# Patient Record
Sex: Male | Born: 2005 | Race: Black or African American | Hispanic: No | Marital: Single | Smoking: Never smoker
Health system: Southern US, Community
[De-identification: ages and names within clinical notes are randomized; demographics above are authoritative.]

---
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
## 2023-08-20 ENCOUNTER — Other Ambulatory Visit: Payer: Self-pay

## 2023-08-20 ENCOUNTER — Emergency Department (HOSPITAL_BASED_OUTPATIENT_CLINIC_OR_DEPARTMENT_OTHER)

## 2023-08-20 ENCOUNTER — Emergency Department (HOSPITAL_BASED_OUTPATIENT_CLINIC_OR_DEPARTMENT_OTHER)
Admission: EM | Admit: 2023-08-20 | Discharge: 2023-08-20 | Disposition: A | Discharge status: Home / Self Care | Attending: Emergency Medicine | Admitting: Emergency Medicine

## 2023-08-20 ENCOUNTER — Encounter (HOSPITAL_BASED_OUTPATIENT_CLINIC_OR_DEPARTMENT_OTHER): Payer: Self-pay

## 2023-08-20 DIAGNOSIS — S99921A Unspecified injury of right foot, initial encounter: Secondary | ICD-10-CM | POA: Diagnosis present

## 2023-08-20 DIAGNOSIS — Y9367 Activity, basketball: Secondary | ICD-10-CM | POA: Insufficient documentation

## 2023-08-20 DIAGNOSIS — X509XXA Other and unspecified overexertion or strenuous movements or postures, initial encounter: Secondary | ICD-10-CM | POA: Diagnosis not present

## 2023-08-20 DIAGNOSIS — S93601A Unspecified sprain of right foot, initial encounter: Secondary | ICD-10-CM | POA: Insufficient documentation

## 2023-08-20 NOTE — ED Notes (Signed)
 Ambulatory to room without difficulty/assistance

## 2023-08-20 NOTE — ED Provider Notes (Signed)
  EMERGENCY DEPARTMENT AT [facility] Provider Note   CSN: [id number] Arrival date & time: 08/20/23  4098     History  Chief Complaint  Patient presents with   Foot Pain    Albert Kelly is a 18 y.o. male, who presents to the ED secondary to right foot pain, has been going on for the past week.  He states about a week ago, he is playing basketball with some friends, and jumped, and then landed on his friend steel toe boot, and hurt the right foot, on the side.  He states for the past week or so it has been very tender, and sharp, especially when he plants his foot down, but he has been able to ambulate.  Denies any ankle pain, or difficulty with range of motion, just states is painful when he flaps his foot down.  Notes that it went away, but yesterday he played basketball, and it flared up again.  Denies any bruising, or wounds to the area.  Has not had any surgery on his foot.  Home Medications Prior to Admission medications   Not on File      Allergies    Patient has no known allergies.    Review of Systems   Review of Systems  Musculoskeletal:  Positive for arthralgias.  Skin:  Negative for wound.    Physical Exam Updated Vital Signs BP (!) 130/40   Pulse 60   Temp 98.5 F (36.9 C) (Oral)   Resp 16   Ht 5\' 7"  (1.702 m)   SpO2 100%  Physical Exam Vitals and nursing note reviewed.  Constitutional:      General: He is not in acute distress.    Appearance: He is well-developed.  HENT:     Head: Normocephalic and atraumatic.  Eyes:     General:        Right eye: No discharge.        Left eye: No discharge.     Conjunctiva/sclera: Conjunctivae normal.  Pulmonary:     Effort: No respiratory distress.  Musculoskeletal:     Comments: Right foot: TTP of base and midshaft of 5th metatarsal. No edema noted. Able to bear weight. Able to plantar flex and dorsiflex ankle. Inversion/eversion intact. Negative Thompson test. No midfoot tenderness to  palpation. Capillary refill <2sec. Dorsalis pedis pulse present. No foot drop noted. Sensation intact. Warm to touch.    Neurological:     Mental Status: He is alert.     Comments: Clear speech.   Psychiatric:        Behavior: Behavior normal.        Thought Content: Thought content normal.     ED Results / Procedures / Treatments   Labs (all labs ordered are listed, but only abnormal results are displayed) Labs Reviewed - No data to display  EKG None  Radiology DG Foot Complete Right Result Date: 08/20/2023 CLINICAL DATA:  Fifth metatarsal pain after a basketball injury 1 week ago. EXAM: RIGHT FOOT COMPLETE - 3+ VIEW COMPARISON:  None Available. FINDINGS: No acute osseous or joint abnormality. IMPRESSION: Negative. Electronically Signed   By: Leanna Battles M.D.   On: 08/20/2023 10:32    Procedures Procedures    Medications Ordered in ED Medications - No data to display  ED Course/ Medical Decision Making/ A&P  Medical Decision Making Patient is a 18 year old male, here for right lateral foot pain, this been going on for the last week.  Improved, but yesterday got aggravated after playing basketball.  He has tenderness to palpation of the fifth metatarsal at the base, and midshaft.  He states he slept his foot down, last week, onto his friend steel toe boot, it hurt for a few days, and then resolved.  He states the pain is pretty mild right now, but is sharp, when he plants his foot down.  Has not had any bruising or swelling.  We will obtain an x-ray, it has been 7 days, so this is reassuring evaluate for fracture.  Especially given the pain resolved, and then reoccurred.  Offered Tylenol and ibuprofen which the patient declined  Amount and/or Complexity of Data Reviewed Radiology: ordered.    Details: X-rays unremarkable Discussion of management or test interpretation with external provider(s): Patient is able to ambulate, and doing well on  exam, only hurts, on more severe exertion, such as playing sports.  He is overall well-appearing, he has no evidence of any kind of fracture on x-ray.  It has been 7 days, thus no fracture on x-ray reassuring.  I encouraged him to follow-up with a primary care doctor, return if symptoms are worsening.  Given that it has been over a week, it resolved, then came back, and suspicious more likely secondary to a strain, or contusion.  I encouraged him to follow-up with primary care doctor, however if symptoms recur, for possible CT of the foot.  At this time I do not think is necessary and he was placed in a postop shoe    Final Clinical Impression(s) / ED Diagnoses Final diagnoses:  Sprain of right foot, initial encounter    Rx / DC Orders ED Discharge Orders     None         Gusta Marksberry, [place], PA 08/20/23 1051    [place], DO 08/20/23 1054

## 2023-08-20 NOTE — ED Triage Notes (Signed)
 States last week was playing basketball and jumped and landed on his friends steel toe shoes. Had pain after, which had improved. Played basketball yesterday and woke up with pain today. Pain to lateral right foot.

## 2023-08-20 NOTE — Discharge Instructions (Addendum)
 You have pain, in the side of your foot, this may be from a foot sprain, or contusion.  Please ice, elevate, and rest the area.  Use these flat toe shoe, as needed, for relief.  If you have worsening pain, please return to the ER follow-up with your primary care doctor.

## 2024-03-17 ENCOUNTER — Other Ambulatory Visit: Payer: Self-pay

## 2024-03-17 ENCOUNTER — Encounter (HOSPITAL_BASED_OUTPATIENT_CLINIC_OR_DEPARTMENT_OTHER): Payer: Self-pay

## 2024-03-17 ENCOUNTER — Emergency Department (HOSPITAL_BASED_OUTPATIENT_CLINIC_OR_DEPARTMENT_OTHER)
Admission: EM | Admit: 2024-03-17 | Discharge: 2024-03-17 | Disposition: A | Discharge status: Home / Self Care | Attending: Emergency Medicine | Admitting: Emergency Medicine

## 2024-03-17 DIAGNOSIS — M546 Pain in thoracic spine: Secondary | ICD-10-CM | POA: Diagnosis present

## 2024-03-17 DIAGNOSIS — Y9367 Activity, basketball: Secondary | ICD-10-CM | POA: Insufficient documentation

## 2024-03-17 DIAGNOSIS — S29019A Strain of muscle and tendon of unspecified wall of thorax, initial encounter: Secondary | ICD-10-CM

## 2024-03-17 DIAGNOSIS — W19XXXA Unspecified fall, initial encounter: Secondary | ICD-10-CM | POA: Insufficient documentation

## 2024-03-17 DIAGNOSIS — S29012A Strain of muscle and tendon of back wall of thorax, initial encounter: Secondary | ICD-10-CM | POA: Insufficient documentation

## 2024-03-17 MED ORDER — METHOCARBAMOL 500 MG PO TABS: 500.0000 mg | ORAL_TABLET | Freq: Three times a day (TID) | ORAL | 0 refills | Status: AC | PRN

## 2024-03-17 MED ORDER — DEXAMETHASONE SOD PHOSPHATE PF 10 MG/ML IJ SOLN
10.0000 mg | Freq: Once | INTRAMUSCULAR | Status: AC
  Administered 2024-03-17: 10 mg via INTRAMUSCULAR

## 2024-03-17 MED ORDER — KETOROLAC TROMETHAMINE 30 MG/ML IJ SOLN
30.0000 mg | Freq: Once | INTRAMUSCULAR | Status: AC
  Administered 2024-03-17: 30 mg via INTRAMUSCULAR
  Filled 2024-03-17: qty 1

## 2024-03-17 MED ORDER — IBUPROFEN 800 MG PO TABS: 800.0000 mg | ORAL_TABLET | Freq: Three times a day (TID) | ORAL | 0 refills | Status: AC | PRN

## 2024-03-17 MED ORDER — DICLOFENAC SODIUM 1 % EX GEL: 2.0000 g | Freq: Four times a day (QID) | CUTANEOUS | 0 refills | Status: AC | PRN

## 2024-03-17 NOTE — Discharge Instructions (Signed)
 You have been seen in the Emergency Department (ED)  today for back pain.  Your workup and exam have not shown any acute abnormalities and you are likely suffering from muscle strain or possible problems with your discs, but there is no treatment that will fix your symptoms at this time.  Please take Motrin (ibuprofen) as needed for your pain according to the instructions written on the box.  Alternatively, for the next five days you can take 600-800mg  three times daily with meals (it may upset your stomach).  Please follow up with your doctor as soon as possible regarding today's ED visit and your back pain.  Return to the ED for worsening back pain, fever, weakness or numbness of either leg, or if you develop either (1) an inability to urinate or have bowel movements, or (2) loss of your ability to control your bathroom functions (if you start having accidents), or if you develop other new symptoms that concern you.

## 2024-03-17 NOTE — ED Notes (Signed)
 Pt alert and oriented X 4 at the time of discharge. RR even and unlabored. No acute distress noted. Pt verbalized understanding of discharge instructions as discussed. Pt ambulatory to lobby at time of discharge.

## 2024-03-17 NOTE — ED Provider Notes (Signed)
 Emergency Department Provider Note   I have reviewed the triage vital signs and the nursing notes.   HISTORY  Chief Complaint Back Pain   HPI Albert Kelly is a 18 y.o. male presents the emergency department with mid back pain worse with movement.  Patient states he was initially holding a plank position and felt like his mid back gave out causing him to fall.  No numbness or weakness in the extremities.  He was able to recover and go on to play basketball and after a jump felt some additional pulling/pain sensation in his mid back and so stopped.  No chest pain or shortness of breath.  No bowel or bladder incontinence.  No retention symptoms.  No fevers. No meds PTA.   History reviewed. No pertinent past medical history.  Review of Systems  Constitutional: No fever/chills Cardiovascular: Denies chest pain. Respiratory: Denies shortness of breath. Gastrointestinal: No abdominal pain.  No nausea, no vomiting.  Musculoskeletal: Positive for back pain. Skin: Negative for rash. Neurological: Negative for headaches, focal weakness or numbness. ____________________________________________   PHYSICAL EXAM:  VITAL SIGNS: ED Triage Vitals  Encounter Vitals Group     BP 03/17/24 0729 (!) 127/104     Pulse Rate 03/17/24 0729 (!) 59     Resp 03/17/24 0729 18     Temp 03/17/24 0729 98.1 F (36.7 C)     Temp Source 03/17/24 0729 Oral     SpO2 03/17/24 0729 100 %   Constitutional: Alert and oriented. Well appearing and in no acute distress. Eyes: Conjunctivae are normal.  Head: Atraumatic. Nose: No congestion/rhinnorhea. Mouth/Throat: Mucous membranes are moist.   Neck: No stridor.   Cardiovascular: Normal rate, regular rhythm. Good peripheral circulation. Grossly normal heart sounds.   Respiratory: Normal respiratory effort.  No retractions. Lungs CTAB. Gastrointestinal: Soft and nontender. No distention.  Musculoskeletal: No lower extremity tenderness nor edema. No gross  deformities of extremities.  No midline thoracic or lumbar spine tenderness.  There is some mild paraspinal tenderness in the thoracic spine region.  Neurologic:  Normal speech and language. No gross focal neurologic deficits are appreciated.  Skin:  Skin is warm, dry and intact. No rash noted.   ____________________________________________   PROCEDURES  Procedure(s) performed:   Procedures  None  ____________________________________________   INITIAL IMPRESSION / ASSESSMENT AND PLAN / ED COURSE  Pertinent labs & imaging results that were available during my care of the patient were reviewed by me and considered in my medical decision making (see chart for details).   This patient is Presenting for Evaluation of back pain, which does require a range of treatment options, and is a complaint that involves a high risk of morbidity and mortality.  The Differential Diagnoses includes but is not exclusive to musculoskeletal back pain, renal colic, urinary tract infection, pyelonephritis, intra-abdominal causes of back pain, aortic aneurysm or dissection, cauda equina syndrome, sciatica, lumbar disc disease, thoracic disc disease, etc.   Critical Interventions-    Medications  ketorolac (TORADOL) 30 MG/ML injection 30 mg (30 mg Intramuscular Given 03/17/24 0745)  dexamethasone (DECADRON) injection 10 mg (10 mg Intramuscular Given 03/17/24 0745)    Reassessment after intervention: symptoms improved.   Radiologic Tests: Considered imaging but no red flag signs or symptoms to prompt emergent MRI or other spine imaging such as x-ray.  Medical Decision Making: Summary:  Patient presents emergency department with back pain.  Seems most consistent with myofascial strain.  No red flag signs or symptoms.  Defer  imaging for now.  Plan for supportive care and pain/anti-inflammatory medications.  I did prescribe Robaxin but also discussed the drowsy side effects of this medication encouraged him  not to drive while taking it.   Patient's presentation is most consistent with acute, uncomplicated illness.   Disposition: discharge  ____________________________________________  FINAL CLINICAL IMPRESSION(S) / ED DIAGNOSES  Final diagnoses:  Thoracic myofascial strain, initial encounter     NEW OUTPATIENT MEDICATIONS STARTED DURING THIS VISIT:  Discharge Medication List as of 03/17/2024  7:41 AM     START taking these medications   Details  diclofenac Sodium (VOLTAREN) 1 % GEL Apply 2 g topically 4 (four) times daily as needed., Starting Sun 03/17/2024, Normal    ibuprofen (ADVIL) 800 MG tablet Take 1 tablet (800 mg total) by mouth every 8 (eight) hours as needed., Starting Sun 03/17/2024, Normal    methocarbamol (ROBAXIN) 500 MG tablet Take 1 tablet (500 mg total) by mouth every 8 (eight) hours as needed., Starting Sun 03/17/2024, Normal        Note:  This document was prepared using Dragon voice recognition software and may include unintentional dictation errors.  Fonda Law, MD, [facility] Emergency Medicine    Tahj Njoku, Fonda MATSU, MD 03/17/24 0800

## 2024-03-17 NOTE — ED Triage Notes (Signed)
 Pt presents with lower back pain that started. Reports playing basketball yesterday and jump, felt something pop when he landed. Denies any other symptoms.

## 2024-04-26 ENCOUNTER — Other Ambulatory Visit: Payer: Self-pay

## 2024-04-26 ENCOUNTER — Emergency Department (HOSPITAL_BASED_OUTPATIENT_CLINIC_OR_DEPARTMENT_OTHER)
Admission: EM | Admit: 2024-04-26 | Discharge: 2024-04-26 | Disposition: A | Discharge status: Home / Self Care | Attending: Emergency Medicine | Admitting: Emergency Medicine

## 2024-04-26 DIAGNOSIS — R21 Rash and other nonspecific skin eruption: Secondary | ICD-10-CM | POA: Insufficient documentation

## 2024-04-26 MED ORDER — PREDNISONE 20 MG PO TABS
40.0000 mg | ORAL_TABLET | Freq: Every day | ORAL | 0 refills | Status: AC
Start: 2024-04-26 — End: 2024-05-01

## 2024-04-26 NOTE — Discharge Instructions (Signed)
 You were given a short course of steroids, please take 2 tablets daily for the next 5 days.  If you experience any systemic signs please return to the emergency department.

## 2024-04-26 NOTE — ED Provider Notes (Signed)
 [facility] Provider Note   CSN: [id number] Arrival date & time: 04/26/24  1521     Patient presents with: Rash   Albert Kelly is a 18 y.o. male.   18 year old male with no past medical history presents to the ED with a chief complaint of rash which has been ongoing for the past week.  Reports going to a concert and staying at hotel, later developed a rash.  This rash is not pruritic unless he does touch it. He denies any new detergent, soaps, new allergens.  Systemic signs such as fever,  The history is provided by the patient.  Rash Associated symptoms: no fever and no shortness of breath        Prior to Admission medications   Medication Sig Start Date End Date Taking? Authorizing Provider  predniSONE (DELTASONE) 20 MG tablet Take 2 tablets (40 mg total) by mouth daily for 5 days. 04/26/24 05/01/24 Yes Anastyn Ayars, PA-C  diclofenac  Sodium (VOLTAREN ) 1 % GEL Apply 2 g topically 4 (four) times daily as needed. 03/17/24   Long, Fonda MATSU, MD  ibuprofen  (ADVIL ) 800 MG tablet Take 1 tablet (800 mg total) by mouth every 8 (eight) hours as needed. 03/17/24   Long, Fonda MATSU, MD  methocarbamol  (ROBAXIN ) 500 MG tablet Take 1 tablet (500 mg total) by mouth every 8 (eight) hours as needed. 03/17/24   Long, Joshua G, MD    Allergies: Patient has no known allergies.    Review of Systems  Constitutional:  Negative for fever.  Respiratory:  Negative for shortness of breath.   Cardiovascular:  Negative for chest pain.  Skin:  Positive for rash.    Updated Vital Signs BP 133/79 (BP Location: Right Arm)   Pulse 81   Temp 98.4 F (36.9 C) (Oral)   Resp 18   SpO2 97%   Physical Exam Vitals and nursing note reviewed.  Constitutional:      Appearance: Normal appearance.  HENT:     Head: Atraumatic.     Mouth/Throat:     Mouth: Mucous membranes are moist.  Cardiovascular:     Rate and Rhythm: Normal rate.  Pulmonary:     Effort:  Pulmonary effort is normal.  Abdominal:     General: Abdomen is flat.  Musculoskeletal:     Cervical back: Normal range of motion and neck supple.  Skin:    General: Skin is warm and dry.  Neurological:     Mental Status: He is alert and oriented to person, place, and time.          (all labs ordered are listed, but only abnormal results are displayed) Labs Reviewed - No data to display  EKG: None  Radiology: No results found.   Procedures   Medications Ordered in the ED - No data to display                                  Medical Decision Making    Patient presents to the ED with a chief complaint of rash which has been for about a week, reports that this has spread from his chest to his arms and his upper neck.  He did go to a concert about a week ago, reports this is when he noted this rash.  He denies this rash itchy, he has not applied anything to the rash.  There is no one  that has the same rash at home.  He did not have any prodromal viral illness prior to this rash appearing, he denies sexual activity at this time, does report he is sexually active with women but this has not occurred in several months.  Denies any systemic send such as fever, diarrhea, abdominal pain.  On evaluation rash has 2 different descriptions, small plaques silvery patches noted, along with some small raised pustules throughout.  Consider monkeypox, however he denies any sexual contact with males.  Considered scabies, however rash is not itchy.  Also consider psoriasis versus eczema however extensors and flexors are spared.  Vitals are within normal limits, we discussed short course of steroids will follow-up with dermatology as needed, return for worsening symptoms.  Hemodynamically stable for discharge.  Portions of this note were generated with Scientist, clinical (histocompatibility and immunogenetics). Dictation errors may occur despite best attempts at proofreading.   Final diagnoses:  Rash    ED Discharge Orders           Ordered    predniSONE (DELTASONE) 20 MG tablet  Daily        04/26/24 1558               Xavia Kniskern, PA-C 04/26/24 1600    Cottie Donnice PARAS, MD 04/26/24 1750

## 2024-04-26 NOTE — ED Triage Notes (Signed)
 Pt reports generalized rash & itching x 1 week. Denies other symptoms

## 2024-04-26 NOTE — ED Notes (Signed)
 Discharge instructions reviewed with patient. Patient questions answered and opportunity for education reviewed. Patient voices understanding of discharge instructions with no further questions. Patient ambulatory with steady gait to lobby.
